# Patient Record
Sex: Female | Born: 2000 | Race: Black or African American | Hispanic: No | Marital: Single | Smoking: Current every day smoker
Health system: Southern US, Community
[De-identification: ages and names within clinical notes are randomized; demographics above are authoritative.]

---
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
## 2019-02-24 ENCOUNTER — Encounter: Payer: Self-pay | Admitting: Family Medicine

## 2019-02-24 ENCOUNTER — Other Ambulatory Visit: Payer: Self-pay

## 2019-02-24 ENCOUNTER — Other Ambulatory Visit (HOSPITAL_COMMUNITY)
Admission: RE | Admit: 2019-02-24 | Discharge: 2019-02-24 | Disposition: A | Discharge status: Home / Self Care | Payer: Medicaid Other | Source: Ambulatory Visit | Attending: Family Medicine | Admitting: Family Medicine

## 2019-02-24 ENCOUNTER — Ambulatory Visit (INDEPENDENT_AMBULATORY_CARE_PROVIDER_SITE_OTHER): Payer: Medicaid Other | Admitting: Family Medicine

## 2019-02-24 VITALS — BP 119/73 | HR 75 | Wt 176.0 lb

## 2019-02-24 DIAGNOSIS — Z113 Encounter for screening for infections with a predominantly sexual mode of transmission: Secondary | ICD-10-CM | POA: Diagnosis present

## 2019-02-24 DIAGNOSIS — Z3009 Encounter for other general counseling and advice on contraception: Secondary | ICD-10-CM | POA: Diagnosis not present

## 2019-02-24 MED ORDER — NORGESTIMATE-ETH ESTRADIOL 0.25-35 MG-MCG PO TABS: 1.0000 | ORAL_TABLET | Freq: Every day | ORAL | 11 refills | Status: DC

## 2019-02-24 NOTE — Progress Notes (Signed)
Having Regular cycles for the past 3 months since been on the pill. Contraception visit  No issues  Ran out of BC Pills  Had sex this morning unprotected. Advised will need to return in  Week and reframe from sexual intercourse.

## 2019-02-24 NOTE — Progress Notes (Signed)
   GYNECOLOGY OFFICE VISIT NOTE  History:   Pamela Ali is a 18 y.o. No obstetric history on file. here today for contraception issue and vaginal discharge.  Ran out of birth control pills 02/21/2019 Last period was 02/18/2019 Last had unprotected sex on this morning No hx of blood clots or migraines  Had BV and chlamydia 3 months ago, no TOC since Some thin white vaginal discharge, notices an odor, not fishy Would like STI screening  No past medical history on file.  No past surgical history on file.  The following portions of the patient's history were reviewed and updated as appropriate: allergies, current medications, past family history, past medical history, past social history, past surgical history and problem list.   Health Maintenance:  Not yet due for pap or mammogram screening.   Review of Systems:  Pertinent items noted in HPI and remainder of comprehensive ROS otherwise negative.  Physical Exam:  BP 119/73   Pulse 75   Wt 176 lb (79.8 kg)  CONSTITUTIONAL: Well-developed, well-nourished female in no acute distress.  HEENT:  Normocephalic, atraumatic. External right and left ear normal. No scleral icterus.  NECK: Normal range of motion, supple, no masses noted on observation SKIN: No rash noted. Not diaphoretic. No erythema. No pallor. MUSCULOSKELETAL: Normal range of motion. No edema noted. NEUROLOGIC: Alert and oriented to person, place, and time. Normal muscle tone coordination.  PSYCHIATRIC: Normal mood and affect. Normal behavior. Normal judgment and thought content. RESPIRATORY: Effort normal, no problems with respiration noted ABDOMEN: No masses noted. No other overt distention noted.   PELVIC: Deferred  Labs and Imaging No results found for this or any previous visit (from the past 168 hour(s)). No results found.    Assessment and Plan:   #Contraception LMP<7 days, no contraindications to OCPs, instructed to restart OCP's immediately and use back up  method for one week.   #STI screening Self swab for STI/vaginitis panel, serum tests as below, will notify of results by mychart.   Problem List Items Addressed This Visit    None    Visit Diagnoses    Encounter for counseling regarding contraception    -  Primary   Relevant Medications   norgestimate-ethinyl estradiol (ORTHO-CYCLEN) 0.25-35 MG-MCG tablet   Screening examination for sexually transmitted disease       Relevant Orders   Cervicovaginal ancillary only( [place])   HIV antibody (with reflex)   RPR   Hepatitis C Antibody   Hepatitis B Surface AntiGEN      Routine preventative health maintenance measures emphasized. Please refer to After Visit Summary for other counseling recommendations.   Return in about 1 year (around 02/24/2020) for Annual Wellness Visit.    Total face-to-face time with patient: 10 minutes.  Over 50% of encounter was spent on counseling and coordination of care.   Augustin Coupe, [place] for [employer], [place]

## 2019-02-25 LAB — CERVICOVAGINAL ANCILLARY ONLY
Bacterial Vaginitis (gardnerella): POSITIVE — AB
Candida Glabrata: NEGATIVE
Candida Vaginitis: NEGATIVE
Chlamydia: NEGATIVE
Comment: NEGATIVE
Comment: NEGATIVE
Comment: NEGATIVE
Comment: NEGATIVE
Comment: NEGATIVE
Comment: NORMAL
Neisseria Gonorrhea: NEGATIVE
Trichomonas: NEGATIVE

## 2019-02-25 LAB — RPR: RPR Ser Ql: NONREACTIVE

## 2019-02-25 LAB — HIV ANTIBODY (ROUTINE TESTING W REFLEX): HIV Screen 4th Generation wRfx: NONREACTIVE

## 2019-02-25 LAB — HEPATITIS B SURFACE ANTIGEN: Hepatitis B Surface Ag: NEGATIVE

## 2019-02-25 LAB — HEPATITIS C ANTIBODY: Hep C Virus Ab: <0.1 s/co ratio (ref 0.0–0.9)

## 2019-02-26 ENCOUNTER — Other Ambulatory Visit: Payer: Self-pay | Admitting: Family Medicine

## 2019-02-26 MED ORDER — METRONIDAZOLE 500 MG PO TABS: 500.0000 mg | ORAL_TABLET | Freq: Two times a day (BID) | ORAL | 0 refills | Status: AC

## 2019-03-02 ENCOUNTER — Telehealth: Payer: Self-pay | Admitting: Licensed Clinical Social Worker

## 2019-03-02 NOTE — Telephone Encounter (Signed)
Called pt to schedule follow up appt for birth control consult. Left message for call back

## 2019-05-22 ENCOUNTER — Ambulatory Visit (INDEPENDENT_AMBULATORY_CARE_PROVIDER_SITE_OTHER): Payer: Medicaid Other

## 2019-05-22 ENCOUNTER — Other Ambulatory Visit (HOSPITAL_COMMUNITY)
Admission: RE | Admit: 2019-05-22 | Discharge: 2019-05-22 | Disposition: A | Discharge status: Home / Self Care | Payer: Medicaid Other | Source: Ambulatory Visit | Attending: Obstetrics & Gynecology | Admitting: Obstetrics & Gynecology

## 2019-05-22 ENCOUNTER — Other Ambulatory Visit: Payer: Self-pay

## 2019-05-22 DIAGNOSIS — Z113 Encounter for screening for infections with a predominantly sexual mode of transmission: Secondary | ICD-10-CM | POA: Diagnosis not present

## 2019-05-22 NOTE — Progress Notes (Signed)
Pt here today for STD testing; reports possible STD exposure. Pt having white, thick vaginal discharge with no odor. Denies any itching or discomfort. Self-swab instructions given and specimen obtained. Explained to pt we will call with any abnormal results.   Fleet Contras RN 05/22/19

## 2019-05-22 NOTE — Progress Notes (Signed)
Patient ID: Pamela Ali, female   DOB: 2000-05-29, 19 y.o.   MRN: [id number] Patient seen and assessed by nursing staff during this encounter. I have reviewed the chart and agree with the documentation and plan.  Scheryl Darter, MD 05/22/2019 12:29 PM

## 2019-05-25 ENCOUNTER — Other Ambulatory Visit: Payer: Self-pay | Admitting: Obstetrics & Gynecology

## 2019-05-25 DIAGNOSIS — Z113 Encounter for screening for infections with a predominantly sexual mode of transmission: Secondary | ICD-10-CM

## 2019-05-25 LAB — CERVICOVAGINAL ANCILLARY ONLY
Bacterial Vaginitis (gardnerella): NEGATIVE
Candida Glabrata: NEGATIVE
Candida Vaginitis: POSITIVE — AB
Chlamydia: NEGATIVE
Comment: NEGATIVE
Comment: NEGATIVE
Comment: NEGATIVE
Comment: NEGATIVE
Comment: NEGATIVE
Comment: NORMAL
Neisseria Gonorrhea: NEGATIVE
Trichomonas: POSITIVE — AB

## 2019-05-25 MED ORDER — METRONIDAZOLE 500 MG PO TABS
ORAL_TABLET | ORAL | 0 refills | Status: DC
Start: 2019-05-25 — End: 2024-01-21

## 2019-05-25 MED ORDER — FLUCONAZOLE 150 MG PO TABS: 150.0000 mg | ORAL_TABLET | Freq: Once | ORAL | 0 refills | Status: AC

## 2019-05-25 NOTE — Progress Notes (Signed)
Flagyl and diflucan sent

## 2019-06-15 ENCOUNTER — Ambulatory Visit: Payer: Medicaid Other | Admitting: Family Medicine

## 2023-12-25 ENCOUNTER — Encounter: Admitting: Obstetrics and Gynecology

## 2024-01-08 ENCOUNTER — Encounter (HOSPITAL_BASED_OUTPATIENT_CLINIC_OR_DEPARTMENT_OTHER): Payer: Self-pay | Admitting: Certified Nurse Midwife

## 2024-01-21 ENCOUNTER — Ambulatory Visit (INDEPENDENT_AMBULATORY_CARE_PROVIDER_SITE_OTHER): Admitting: Obstetrics & Gynecology

## 2024-01-21 ENCOUNTER — Other Ambulatory Visit (HOSPITAL_COMMUNITY)
Admission: RE | Admit: 2024-01-21 | Discharge: 2024-01-21 | Disposition: A | Source: Ambulatory Visit | Attending: Obstetrics & Gynecology | Admitting: Obstetrics & Gynecology

## 2024-01-21 ENCOUNTER — Encounter: Payer: Self-pay | Admitting: Obstetrics & Gynecology

## 2024-01-21 VITALS — BP 105/70 | HR 78 | Ht 60.0 in | Wt 199.6 lb

## 2024-01-21 DIAGNOSIS — Z113 Encounter for screening for infections with a predominantly sexual mode of transmission: Secondary | ICD-10-CM | POA: Diagnosis not present

## 2024-01-21 DIAGNOSIS — Z01419 Encounter for gynecological examination (general) (routine) without abnormal findings: Secondary | ICD-10-CM

## 2024-01-21 DIAGNOSIS — Z23 Encounter for immunization: Secondary | ICD-10-CM | POA: Diagnosis not present

## 2024-01-21 DIAGNOSIS — N911 Secondary amenorrhea: Secondary | ICD-10-CM

## 2024-01-21 MED ORDER — MEDROXYPROGESTERONE ACETATE 10 MG PO TABS: 10.0000 mg | ORAL_TABLET | Freq: Every day | ORAL | 2 refills | Status: AC

## 2024-01-21 NOTE — Progress Notes (Addendum)
 GYNECOLOGY ANNUAL PREVENTATIVE CARE ENCOUNTER NOTE  History:    Pamela Ali is a 22 y.o. G0P0000 female here for a routine annual gynecologic exam.  Current complaints: no periods in 6 months.  History of irregular menses since menarche, only gets periods 2-3 times a year, no previous evaluation. No headaches, nipple discharge, increased acne or abnormal hair growth or other symptoms. She is concerned about PCOS, wants evaluation for this as she wants to conceive soon. Took several home UPTs, last one 2 days ago, that were all negative.  In addition to her pap smear today, she desires STI screening. Also desires flu shot.  Denies abnormal vaginal discharge, pelvic pain, problems with intercourse or other gynecologic concerns.  Gynecologic History Patient's last menstrual period was 07/04/2023. Contraception: none Last Pap: none.  Obstetric History OB History  Gravida Para Term Preterm AB Living  0 0 0 0 0 0  SAB IAB Ectopic Multiple Live Births  0 0 0 0 0    History reviewed. No pertinent past medical history.  History reviewed. No pertinent surgical history.  No current outpatient medications on file prior to visit.   No current facility-administered medications on file prior to visit.    No Known Allergies  Social History:  reports that she has been smoking cigars. She has never used smokeless tobacco. She reports current alcohol use. She reports current drug use. Drug: Marijuana.  History reviewed. No pertinent family history.  The following portions of the patient's history were reviewed and updated as appropriate: allergies, current medications, past family history, past medical history, past social history, past surgical history and problem list.  Review of Systems Pertinent items noted in HPI and remainder of comprehensive ROS otherwise negative.  Physical Exam: Chaperone present [name], CMA)  BP 105/70   Pulse 78   Ht 5' (1.524 m)   Wt 199 lb 9.6 oz  (90.5 kg)   LMP 07/04/2023   BMI 38.98 kg/m  CONSTITUTIONAL: Well-developed, well-nourished female in no acute distress.  HENT:  Normocephalic, atraumatic, External right and left ear normal.  EYES: Conjunctivae and EOM are normal. Pupils are equal, round, and reactive to light. No scleral icterus.  NECK: Normal range of motion, supple, no masses observed. SKIN: Skin is warm and dry. No rash noted. Not diaphoretic. No erythema. No pallor. MUSCULOSKELETAL: Normal range of motion. No tenderness.  No cyanosis, clubbing, or edema. NEUROLOGIC: Alert and oriented to person, place, and time. Normal muscle tone coordination.  PSYCHIATRIC: Normal mood and affect. Normal behavior. Normal judgment and thought content. CARDIOVASCULAR: Normal heart rate noted, regular rhythm RESPIRATORY: Clear to auscultation bilaterally. Effort and breath sounds normal, no problems with respiration noted. BREASTS: Symmetric in size. No masses, tenderness, skin changes, nipple drainage, or lymphadenopathy bilaterally. Bilateral nipple rings present. Performed in the presence of a chaperone.  ABDOMEN: Soft, no distention noted.  No tenderness, rebound or guarding.  PELVIC: Normal appearing external genitalia and urethral meatus; normal appearing vaginal mucosa and cervix.  No abnormal vaginal discharge noted.  Pap smear obtained.  Normal uterine size, no other palpable masses, no uterine or adnexal tenderness.  Performed in the presence of a chaperone.  Assessment and Plan:     1. Amenorrhea, secondary Discussed amenorrhea and possible etiologies. Labs and ultrasound ordered, will follow up results and manage accordingly. Provera challenge given: Provera 10 mg daily x 10 days and see if she gets her period usually within 7 days.  If not, she will have to  do an estrogen-progestin challenge which is Estradiol  1 mg daily x 35 days but also add Provera 10 mg daily for last 10 days (days 26-35). Wait until 7 days after this  regimen to see if bleeding occurs. - CBC - Comprehensive metabolic panel with GFR - DHEA-sulfate - Follicle stimulating hormone - Beta hCG quant (ref lab) - Hemoglobin A1c - Prolactin - Testosterone,Free and Total - TSH Rfx on Abnormal to Free T4 - Anti mullerian hormone - US  PELVIC COMPLETE WITH TRANSVAGINAL; Future - medroxyPROGESTERone (PROVERA) 10 MG tablet; Take 1 tablet (10 mg total) by mouth daily. Use for ten days  Dispense: 10 tablet; Refill: 2 Total time spent in discussing amenorrhea and ordering tests: 45 minutes  2. Flu vaccine need - Flu vaccine trivalent PF, 6mos and older(Flulaval,Afluria,Fluarix,Fluzone) given  3. Routine screening for STI (sexually transmitted infection) STI screen done, will follow up results and manage accordingly. - Cervicovaginal ancillary only( [place]) - HIV antibody (with reflex) - RPR - Hepatitis C Antibody - Hepatitis B Surface AntiGEN  4. Well woman exam with routine gynecological exam (Primary) - Cytology - PAP( [place]) Will follow up results of pap smear and manage accordingly. Advised to find out if she has received HPV vaccine series.  Normal breast examination today, she was advised to perform periodic self breast examinations.  Routine preventative health maintenance measures emphasized. Please refer to After Visit Summary for other counseling recommendations.      [name], MD, FACOG Obstetrician & Gynecologist, [facility] for [employer], [facility]

## 2024-01-22 ENCOUNTER — Ambulatory Visit: Payer: Self-pay | Admitting: Obstetrics & Gynecology

## 2024-01-22 LAB — CYTOLOGY - PAP: Diagnosis: NEGATIVE

## 2024-01-22 LAB — CERVICOVAGINAL ANCILLARY ONLY
Chlamydia: NEGATIVE
Comment: NEGATIVE
Comment: NEGATIVE
Comment: NORMAL
Neisseria Gonorrhea: NEGATIVE
Trichomonas: NEGATIVE

## 2024-01-24 ENCOUNTER — Ambulatory Visit (HOSPITAL_COMMUNITY)
Admission: RE | Admit: 2024-01-24 | Discharge: 2024-01-24 | Disposition: A | Discharge status: Home / Self Care | Source: Ambulatory Visit | Attending: Obstetrics & Gynecology | Admitting: Obstetrics & Gynecology

## 2024-01-24 DIAGNOSIS — N911 Secondary amenorrhea: Secondary | ICD-10-CM | POA: Diagnosis present

## 2024-01-25 LAB — BETA HCG QUANT (REF LAB): hCG Quant: <1 m[IU]/mL

## 2024-01-25 LAB — TESTOSTERONE,FREE AND TOTAL
Testosterone, Free: 3.8 pg/mL (ref 0.0–4.2)
Testosterone: 88 ng/dL — ABNORMAL HIGH (ref 13–71)

## 2024-01-25 LAB — COMPREHENSIVE METABOLIC PANEL WITH GFR
ALT: 11 IU/L (ref 0–32)
AST: 13 IU/L (ref 0–40)
Albumin: 4.1 g/dL (ref 4.0–5.0)
Alkaline Phosphatase: 58 IU/L (ref 41–116)
BUN/Creatinine Ratio: 21 (ref 9–23)
BUN: 16 mg/dL (ref 6–20)
Bilirubin Total: 0.4 mg/dL (ref 0.0–1.2)
CO2: 23 mmol/L (ref 20–29)
Calcium: 9 mg/dL (ref 8.7–10.2)
Chloride: 102 mmol/L (ref 96–106)
Creatinine, Ser: 0.78 mg/dL (ref 0.57–1.00)
Globulin, Total: 3.2 g/dL (ref 1.5–4.5)
Glucose: 93 mg/dL (ref 70–99)
Potassium: 4.1 mmol/L (ref 3.5–5.2)
Sodium: 140 mmol/L (ref 134–144)
Total Protein: 7.3 g/dL (ref 6.0–8.5)
eGFR: 109 mL/min/1.73 (ref >59)

## 2024-01-25 LAB — HEPATITIS C ANTIBODY: Hep C Virus Ab: NONREACTIVE

## 2024-01-25 LAB — DHEA-SULFATE: DHEA-SO4: 340 ug/dL (ref 110.0–431.7)

## 2024-01-25 LAB — CBC
Hematocrit: 40.4 % (ref 34.0–46.6)
Hemoglobin: 12.9 g/dL (ref 11.1–15.9)
MCH: 27.9 pg (ref 26.6–33.0)
MCHC: 31.9 g/dL (ref 31.5–35.7)
MCV: 87 fL (ref 79–97)
Platelets: 237 x10E3/uL (ref 150–450)
RBC: 4.63 x10E6/uL (ref 3.77–5.28)
RDW: 12.8 % (ref 11.7–15.4)
WBC: 4.2 x10E3/uL (ref 3.4–10.8)

## 2024-01-25 LAB — ANTI MULLERIAN HORMONE: ANTI-MULLERIAN HORMONE (AMH): 21 ng/mL — ABNORMAL HIGH

## 2024-01-25 LAB — RPR: RPR Ser Ql: NONREACTIVE

## 2024-01-25 LAB — HEPATITIS B SURFACE ANTIGEN: Hepatitis B Surface Ag: NEGATIVE

## 2024-01-25 LAB — HEMOGLOBIN A1C
Est. average glucose Bld gHb Est-mCnc: 103 mg/dL
Hgb A1c MFr Bld: 5.2 % (ref 4.8–5.6)

## 2024-01-25 LAB — HIV ANTIBODY (ROUTINE TESTING W REFLEX): HIV Screen 4th Generation wRfx: NONREACTIVE

## 2024-01-25 LAB — FOLLICLE STIMULATING HORMONE: FSH: 4.1 m[IU]/mL

## 2024-01-25 LAB — TSH RFX ON ABNORMAL TO FREE T4: TSH: 1.09 u[IU]/mL (ref 0.450–4.500)

## 2024-01-25 LAB — PROLACTIN: Prolactin: 22.8 ng/mL (ref 4.8–33.4)

## 2024-01-27 NOTE — Progress Notes (Signed)
 Elevated testosterone and AMH levels are consistent with PCOS. Also has polycystic ovaries on ultrasound and abnormal menstrual periods which also support this diagnosis. Can discuss implications of this at next visit.

## 2024-01-27 NOTE — Progress Notes (Signed)
 Patient has an abnormally thickened and heterogeneous endometrium on ultrasound.  She needs endometrial sampling to evaluate for abnormal cells: either endometrial biopsy in office or Hysteroscopy, D&C in OR. She needs appointment with any MD to discuss these recommendations and plan of action. Please call and inform patient of results and recommendations.  Dr. Herchel

## 2024-02-11 ENCOUNTER — Ambulatory Visit: Admitting: Obstetrics and Gynecology

## 2024-02-12 ENCOUNTER — Ambulatory Visit (INDEPENDENT_AMBULATORY_CARE_PROVIDER_SITE_OTHER): Admitting: Obstetrics & Gynecology

## 2024-02-12 VITALS — BP 112/76 | HR 73 | Wt 192.0 lb

## 2024-02-12 DIAGNOSIS — Z3041 Encounter for surveillance of contraceptive pills: Secondary | ICD-10-CM

## 2024-02-12 DIAGNOSIS — E282 Polycystic ovarian syndrome: Secondary | ICD-10-CM

## 2024-02-12 DIAGNOSIS — Z3009 Encounter for other general counseling and advice on contraception: Secondary | ICD-10-CM

## 2024-02-12 MED ORDER — NORGESTIMATE-ETH ESTRADIOL 0.25-35 MG-MCG PO TABS: 1.0000 | ORAL_TABLET | Freq: Every day | ORAL | 11 refills | Status: AC

## 2024-02-12 NOTE — Progress Notes (Signed)
 Patient ID: Pamela Ali, female   DOB: Oct 16, 2000, 23 y.o.   MRN: [id number]  Chief Complaint  Patient presents with   Follow-up    HPI Pamela Ali is a 23 y.o. female.  [id number] Patient's last menstrual period was 02/02/2024. She returns today to discuss management of PCOS. US  showed PCOS signs and thickened endometrium. Testosterone was elevated and AMH. She is not trying to conceive with her recent partner and is open to using OCP for cycle control. HPI  No past medical history on file.  No past surgical history on file.  No family history on file.  Social History Social History   Tobacco Use   Smoking status: Every Day    Types: Cigars   Smokeless tobacco: Never   Tobacco comments:    Black and mild   Vaping Use   Vaping status: Some Days  Substance Use Topics   Alcohol use: Yes    Comment: socially   Drug use: Yes    Types: Marijuana    No Known Allergies  Current Outpatient Medications  Medication Sig Dispense Refill   medroxyPROGESTERone (PROVERA) 10 MG tablet Take 1 tablet (10 mg total) by mouth daily. Use for ten days 10 tablet 2   norgestimate -ethinyl estradiol  (ORTHO-CYCLEN) 0.25-35 MG-MCG tablet Take 1 tablet by mouth daily. 28 tablet 11   No current facility-administered medications for this visit.    Review of Systems Review of Systems  Genitourinary:  Positive for menstrual problem (irregular menses).    Blood pressure 112/76, pulse 73, weight 192 lb (87.1 kg), last menstrual period 02/02/2024.  Physical Exam Physical Exam Vitals and nursing note reviewed.  Constitutional:      Appearance: Normal appearance.  HENT:     Head: Normocephalic and atraumatic.  Cardiovascular:     Rate and Rhythm: Normal rate.  Pulmonary:     Effort: Pulmonary effort is normal.  Neurological:     Mental Status: She is alert.  Psychiatric:        Mood and Affect: Mood normal.        Behavior: Behavior normal.     Data Reviewed Narrative & Impression   CLINICAL DATA:  Amenorrhea   EXAM: TRANSABDOMINAL AND TRANSVAGINAL ULTRASOUND OF PELVIS   TECHNIQUE: Both transabdominal and transvaginal ultrasound examinations of the pelvis were performed. Transabdominal technique was performed for global imaging of the pelvis including uterus, ovaries, adnexal regions, and pelvic cul-de-sac. It was necessary to proceed with endovaginal exam following the transabdominal exam to visualize the ovaries.   COMPARISON:  None Available.   FINDINGS: Uterus   Measurements: 6.9 x 3 x 3.2 cm = volume: 35 mL. No fibroids or other mass visualized.   Endometrium   Thickness: 18 mm. Heterogeneous endometrial echo complex with possible echogenic polypoid lesion measuring 2 cm. Multiple subcentimeter cystic changes are identified within the endometrial thickness.   Right ovary   Measurements: 4.2 x 2.2 x 2.5 cm = volume: 12 mL. Peripheral located follicles, otherwise normal appearance/no adnexal mass.   Left ovary   Measurements: 3.8 x 3.3 x 2.5 cm = volume: 16 mL. Peripheral located focus, otherwise normal appearance/no adnexal mass.   Other findings   No abnormal free fluid.   IMPRESSION: Heterogeneous hypertrophied endometrial echo complex with possible polypoid lesion. Recommend sonohysterogram or MRI pelvis for further characterization.   Enlarged bilateral ovaries containing peripheral located follicles which can be seen the setting of polycystic ovaries in the proper clinical and lab settings.     Electronically  Signed   By: Megan  Zare M.D.   On: 01/24/2024 17:13      Assessment PCOS (polycystic ovarian syndrome)  Encounter for counseling regarding contraception - Plan: norgestimate -ethinyl estradiol  (ORTHO-CYCLEN) 0.25-35 MG-MCG tablet   Plan RTC to review progress in 4 months after taking OCP for cycle control, hormonal management, and plan to repeat US  due to thickened endometrium on last scan    Pamela Ali 02/12/2024, 11:43 AM

## 2024-02-12 NOTE — Progress Notes (Signed)
 Pt is in office to review u/s and plan. Pt was given Provera at last appt.  Pt did have a cycle this past week, was heavy the first 3-4 days.

## 2024-02-20 ENCOUNTER — Encounter: Admitting: Obstetrics and Gynecology

## 2024-02-20 ENCOUNTER — Ambulatory Visit: Admitting: Obstetrics and Gynecology
